# Patient Record
Sex: Female | Born: 1995 | Hispanic: No | Marital: Single
Health system: Southern US, Community
[De-identification: ages and names within clinical notes are randomized; demographics above are authoritative.]

---
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
## 2016-08-27 ENCOUNTER — Encounter (HOSPITAL_COMMUNITY): Payer: Self-pay | Admitting: Emergency Medicine

## 2016-08-27 ENCOUNTER — Emergency Department (HOSPITAL_COMMUNITY)
Admission: EM | Admit: 2016-08-27 | Discharge: 2016-08-27 | Disposition: A | Discharge status: Home / Self Care | Payer: BLUE CROSS/BLUE SHIELD | Attending: Emergency Medicine | Admitting: Emergency Medicine

## 2016-08-27 DIAGNOSIS — Y929 Unspecified place or not applicable: Secondary | ICD-10-CM | POA: Diagnosis not present

## 2016-08-27 DIAGNOSIS — S80852A Superficial foreign body, left lower leg, initial encounter: Secondary | ICD-10-CM | POA: Insufficient documentation

## 2016-08-27 DIAGNOSIS — Z7722 Contact with and (suspected) exposure to environmental tobacco smoke (acute) (chronic): Secondary | ICD-10-CM | POA: Insufficient documentation

## 2016-08-27 DIAGNOSIS — Y939 Activity, unspecified: Secondary | ICD-10-CM | POA: Insufficient documentation

## 2016-08-27 DIAGNOSIS — Y999 Unspecified external cause status: Secondary | ICD-10-CM | POA: Diagnosis not present

## 2016-08-27 DIAGNOSIS — X58XXXA Exposure to other specified factors, initial encounter: Secondary | ICD-10-CM | POA: Diagnosis not present

## 2016-08-27 DIAGNOSIS — M795 Residual foreign body in soft tissue: Secondary | ICD-10-CM

## 2016-08-27 MED ORDER — TETANUS-DIPHTH-ACELL PERTUSSIS 5-2.5-18.5 LF-MCG/0.5 IM SUSP
0.5000 mL | Freq: Once | INTRAMUSCULAR | Status: AC
  Administered 2016-08-27: 0.5 mL via INTRAMUSCULAR
  Filled 2016-08-27: qty 0.5

## 2016-08-27 NOTE — Discharge Instructions (Signed)
Keep the area clean and dry.   Watch for signs of infection.   See your doctor.   Return to ER if you have fever, worse redness and swelling, purulent drainage from the area
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
## 2016-08-27 NOTE — ED Triage Notes (Signed)
Pt presents from work with small metal object in L posterior calf; pt states small metal shard flew and struck her leg, was able to remove most of the object but some remains, pt denies pain; pt has workers comp papers with her
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
## 2016-08-27 NOTE — ED Notes (Signed)
registration at bedside

## 2022-04-06 ENCOUNTER — Inpatient Hospital Stay (HOSPITAL_COMMUNITY)
Admission: AD | Admit: 2022-04-06 | Discharge: 2022-04-07 | Disposition: A | Discharge status: Home / Self Care | Payer: BLUE CROSS/BLUE SHIELD | Attending: Family Medicine | Admitting: Family Medicine

## 2022-06-26 ENCOUNTER — Emergency Department (HOSPITAL_COMMUNITY)
Admission: EM | Admit: 2022-06-26 | Discharge: 2022-06-26 | Disposition: A | Discharge status: Home / Self Care | Payer: 59 | Attending: Emergency Medicine | Admitting: Emergency Medicine

## 2022-06-26 ENCOUNTER — Other Ambulatory Visit: Payer: Self-pay

## 2022-06-26 DIAGNOSIS — J069 Acute upper respiratory infection, unspecified: Secondary | ICD-10-CM

## 2022-06-26 DIAGNOSIS — Z1152 Encounter for screening for COVID-19: Secondary | ICD-10-CM | POA: Diagnosis not present

## 2022-06-26 DIAGNOSIS — R059 Cough, unspecified: Secondary | ICD-10-CM | POA: Diagnosis present

## 2022-06-26 LAB — RESP PANEL BY RT-PCR (RSV, FLU A&B, COVID)  RVPGX2
Influenza A by PCR: NEGATIVE
Influenza B by PCR: NEGATIVE
Resp Syncytial Virus by PCR: NEGATIVE
SARS Coronavirus 2 by RT PCR: NEGATIVE

## 2022-06-26 NOTE — ED Triage Notes (Addendum)
Pt c/o  productive cough w/clear/white phlegm, congestion, and L ear pain for 1x week.  AOx4

## 2022-06-26 NOTE — ED Provider Notes (Signed)
[place] EMERGENCY DEPARTMENT AT [facility] Provider Note   CSN: [id number] Arrival date & time: 06/26/22  1706     History  Chief Complaint  Patient presents with   Otalgia   URI    Lisa Trujillo is a 27 y.o. female presents for cough, phlegm, sore throat, ear pain.  When asked what her main concern in the emergency department was today patient reported that she wants to know whether or not she could have COVID, and a note for work.  She denies any chest pain, significant difficulty breathing.  She does not have any history of asthma.  She has not been having abdominal pain, diarrhea.   Otalgia URI Presenting symptoms: ear pain        Home Medications Prior to Admission medications   Medication Sig Start Date End Date Taking? Authorizing Provider  cephALEXin (KEFLEX) 500 MG capsule Take 1 capsule (500 mg total) by mouth 4 (four) times daily. 11/08/12   Hess, Nada Boozer, PA-C      Allergies    Patient has no known allergies.    Review of Systems   Review of Systems  HENT:  Positive for ear pain.   All other systems reviewed and are negative.   Physical Exam Updated Vital Signs BP 93/69 (BP Location: Left Arm)   Pulse 86   Temp 98.3 F (36.8 C) (Oral)   Resp 18   Ht 5' (1.524 m)   Wt 51.3 kg   SpO2 96%   BMI 22.07 kg/m  Physical Exam Vitals and nursing note reviewed.  Constitutional:      General: She is not in acute distress.    Appearance: Normal appearance.  HENT:     Head: Normocephalic and atraumatic.     Mouth/Throat:     Comments: No significant posterior oropharynx erythema, swelling, exudate. Uvula midline, tonsils 1+ bilaterally.  No trismus, stridor, evidence of PTA, floor of mouth swelling or redness.   Eyes:     General:        Right eye: No discharge.        Left eye: No discharge.     Comments: Patient with normal appearance of the tympanic membrane on the left, no erythema, the auditory canal is normal-appearing.   Cardiovascular:     Rate and Rhythm: Normal rate and regular rhythm.  Pulmonary:     Effort: Pulmonary effort is normal. No respiratory distress.     Comments: No wheezing, rhonchi, stridor, rales, no focal consolidation on my exam. Musculoskeletal:        General: No deformity.  Skin:    General: Skin is warm and dry.  Neurological:     Mental Status: She is alert and oriented to person, place, and time.  Psychiatric:        Mood and Affect: Mood normal.        Behavior: Behavior normal.     ED Results / Procedures / Treatments   Labs (all labs ordered are listed, but only abnormal results are displayed) Labs Reviewed  RESP PANEL BY RT-PCR (RSV, FLU A&B, COVID)  [id number]    EKG None  Radiology No results found.  Procedures Procedures    Medications Ordered in ED Medications - No data to display  ED Course/ Medical Decision Making/ A&P                             Medical Decision Making  This is a well-appearing 27yo female who presents with concern for 7 days of cough, sore throat, headache, ear pain.  My emergent differential diagnosis includes acute upper respiratory infection with COVID, flu, RSV versus new asthma presentation, acute bronchitis, less clinical concern for pneumonia.  Also considered other ENT emergencies, Ludwig angina, strep pharyngitis, mono, versus epiglottis, tonsillitis versus other.  This is not an exhaustive differential.  On my exam patient is overall well-appearing, they have temperature of, 98.3, breathing unlabored, no tachypnea, no respiratory distress, stable oxygen saturation.  Patient without tachycardia.  Bilateral TMs are clear.  RVP pending at this time, but with her stable vital signs discussed that I would not recommend any treatment even if she is positive for COVID, flu, and happy to provide a work note and she can follow up on her RVP. Patient symptoms are consistent with upper respiratory infectious syndrome, she has no evidence  of developing pneumonia, clear lung sounds on my exam.  Encouraged ibuprofen, Tylenol, rest, plenty of fluids.  Discussed extensive return precautions.  Patient discharged in stable condition at this time.  Final Clinical Impression(s) / ED Diagnoses Final diagnoses:  Viral upper respiratory tract infection    Rx / DC Orders ED Discharge Orders     None         [place] 06/26/22 1813    Gwyneth Sprout, MD 06/26/22 2244

## 2023-02-25 ENCOUNTER — Other Ambulatory Visit: Payer: Self-pay

## 2023-02-25 ENCOUNTER — Emergency Department (HOSPITAL_BASED_OUTPATIENT_CLINIC_OR_DEPARTMENT_OTHER): Payer: Self-pay

## 2023-02-25 ENCOUNTER — Emergency Department (HOSPITAL_BASED_OUTPATIENT_CLINIC_OR_DEPARTMENT_OTHER): Payer: Self-pay | Admitting: Radiology

## 2023-02-25 ENCOUNTER — Encounter (HOSPITAL_BASED_OUTPATIENT_CLINIC_OR_DEPARTMENT_OTHER): Payer: Self-pay | Admitting: Emergency Medicine

## 2023-02-25 ENCOUNTER — Emergency Department (HOSPITAL_BASED_OUTPATIENT_CLINIC_OR_DEPARTMENT_OTHER)
Admission: EM | Admit: 2023-02-25 | Discharge: 2023-02-25 | Disposition: A | Discharge status: Home / Self Care | Payer: Self-pay | Attending: Emergency Medicine | Admitting: Emergency Medicine

## 2023-02-25 DIAGNOSIS — S161XXA Strain of muscle, fascia and tendon at neck level, initial encounter: Secondary | ICD-10-CM

## 2023-02-25 DIAGNOSIS — S300XXA Contusion of lower back and pelvis, initial encounter: Secondary | ICD-10-CM | POA: Insufficient documentation

## 2023-02-25 DIAGNOSIS — S60512A Abrasion of left hand, initial encounter: Secondary | ICD-10-CM | POA: Insufficient documentation

## 2023-02-25 DIAGNOSIS — M79642 Pain in left hand: Secondary | ICD-10-CM

## 2023-02-25 DIAGNOSIS — S0083XA Contusion of other part of head, initial encounter: Secondary | ICD-10-CM | POA: Insufficient documentation
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
## 2023-02-25 NOTE — Discharge Instructions (Signed)
As we discussed, everything checked out on the imaging.  I would like for you to follow-up with your primary care doctor if you have one. You are more than welcome to come back to the emergency room for any worsening symptoms.  From a pain perspective, you can take 600 mg and ibuprofen every 6 hours as needed for pain.
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
